# Patient Record
Sex: Female | Born: 1995 | Race: White | Hispanic: No | Marital: Married | Smoking: Never smoker
Health system: Southern US, Community
[De-identification: ages and names within clinical notes are randomized; demographics above are authoritative.]

## PROBLEM LIST (undated history)

## (undated) DIAGNOSIS — D619 Aplastic anemia, unspecified: Secondary | ICD-10-CM

## (undated) DIAGNOSIS — J45909 Unspecified asthma, uncomplicated: Secondary | ICD-10-CM

## (undated) DIAGNOSIS — F909 Attention-deficit hyperactivity disorder, unspecified type: Secondary | ICD-10-CM

## (undated) HISTORY — PX: PORTA CATH INSERTION: CATH118285

---
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
## 2017-12-31 IMAGING — CT CT ANGIO CHEST
2 of 6 series · 18 of 36 positions shown · IV contrast (Omni 300)
Comparison: Chest radiograph dated 11/29/2016

CLINICAL DATA: 20-year-old female with chest pain and shortness of
breath. Tachycardia.

EXAM:
CT ANGIOGRAPHY CHEST WITH CONTRAST
TECHNIQUE: Multidetector CT imaging of the chest was performed using the
standard protocol during bolus administration of intravenous
contrast. Multiplanar CT image reconstructions and MIPs were
obtained to evaluate the vascular anatomy.
CONTRAST:  100 cc Isovue 370

[Series 6: pe thins · axial · 0.80mm/px · z∈[+972,+1210]mm · 17 of 268 slices shown]
[im 15/268  lung]
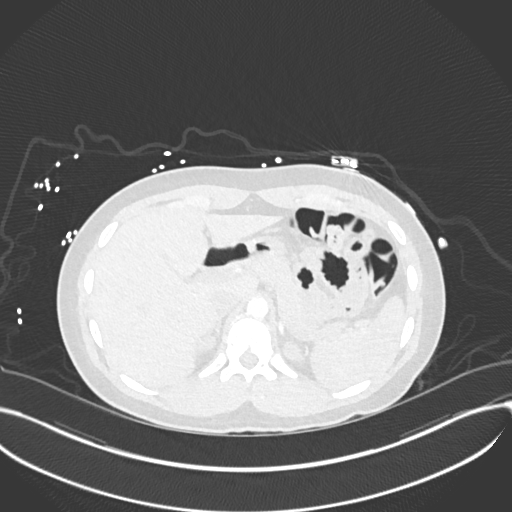
[im 30/268  mediastinal]
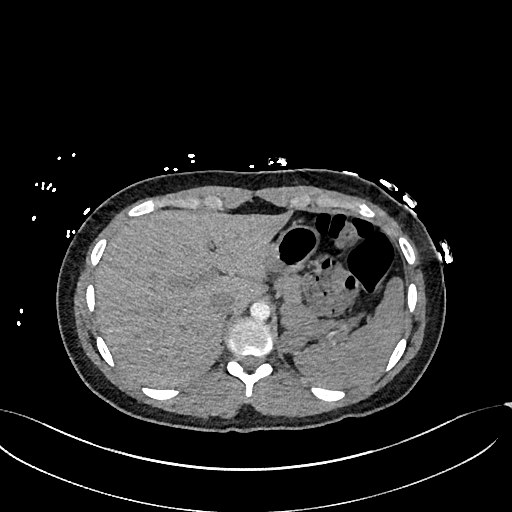
[im 45/268  lung]
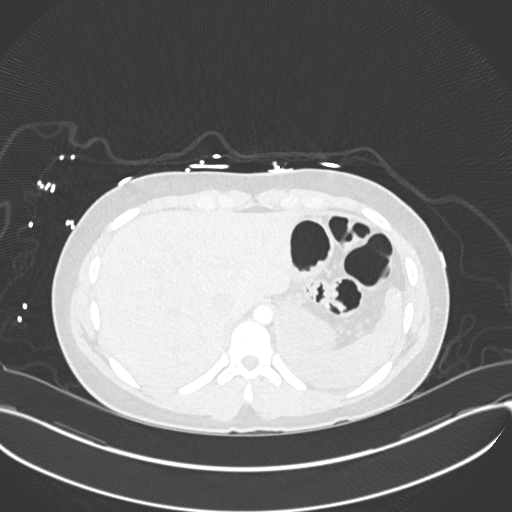
[im 60/268  mediastinal]
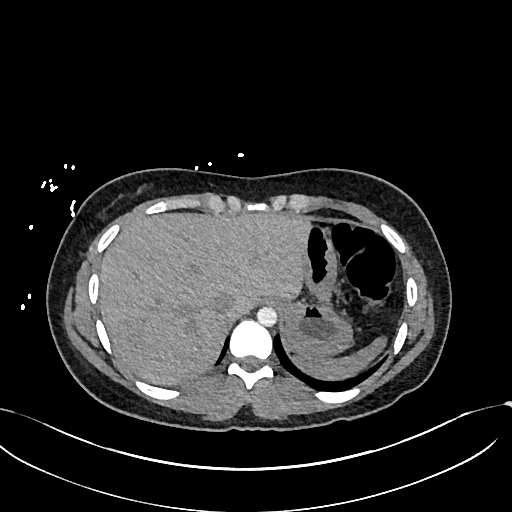
[im 75/268  lung]
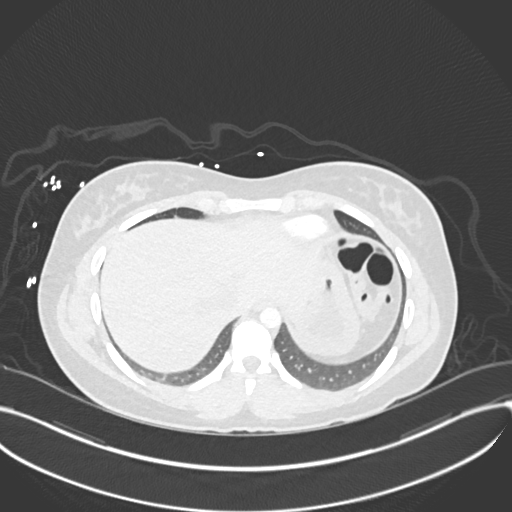
[im 90/268  mediastinal]
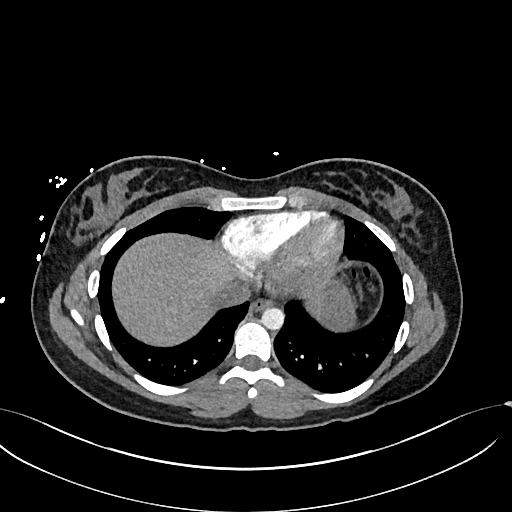
[im 104/268  lung]
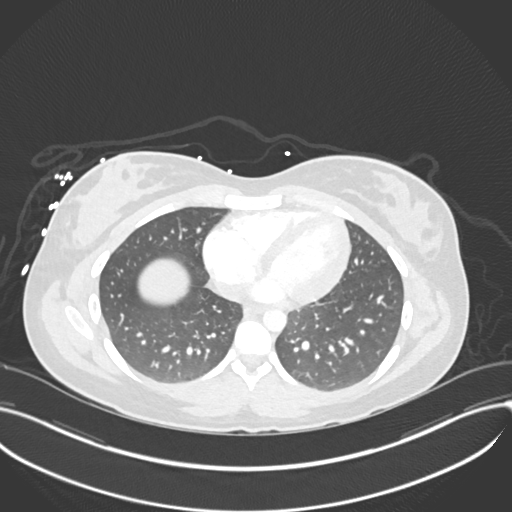
[im 119/268  mediastinal]
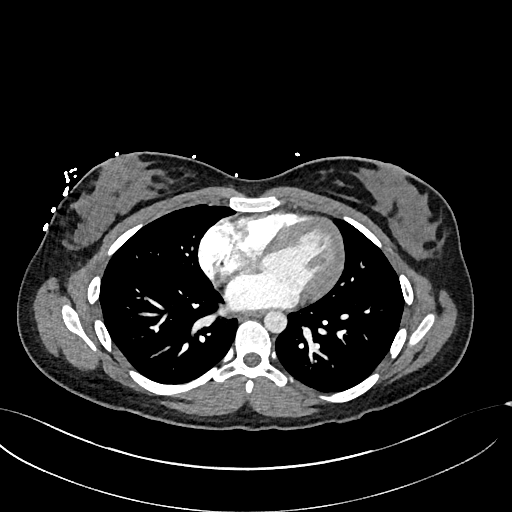
[im 134/268  lung]
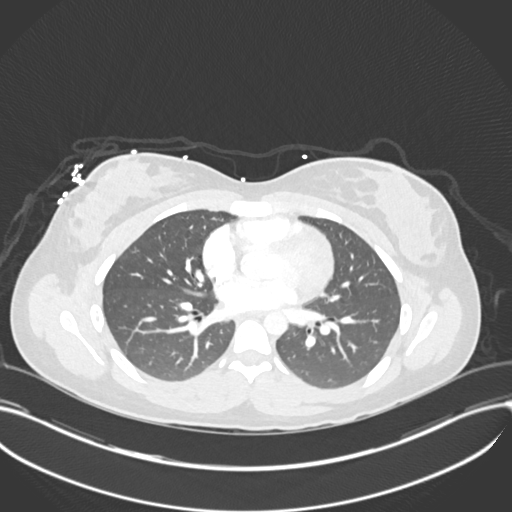
[im 149/268  mediastinal]
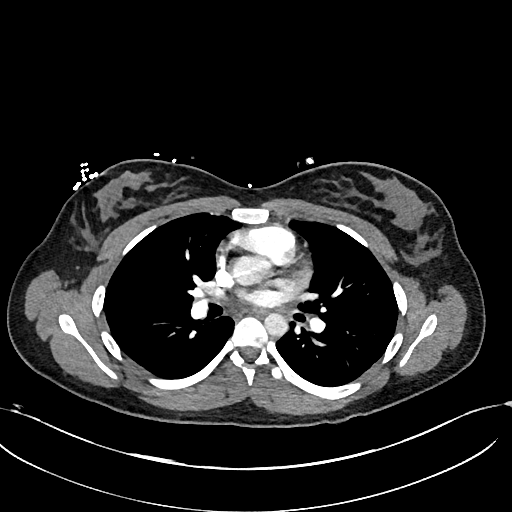
[im 164/268  lung]
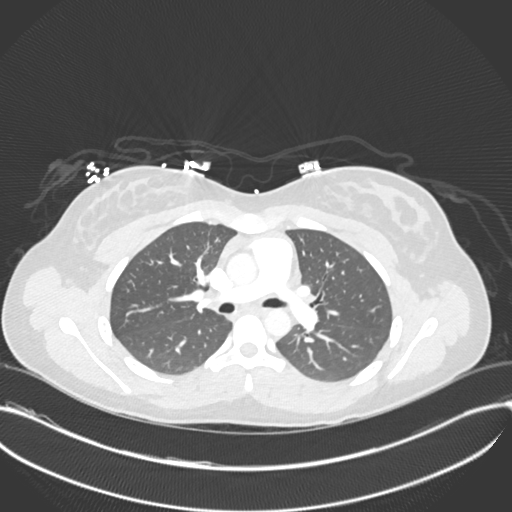
[im 179/268  mediastinal]
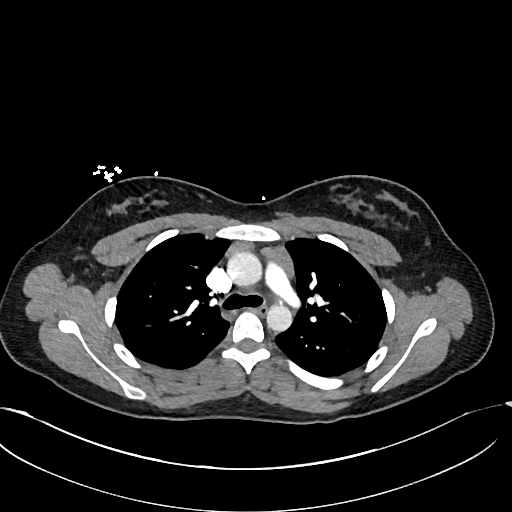
[im 193/268  lung]
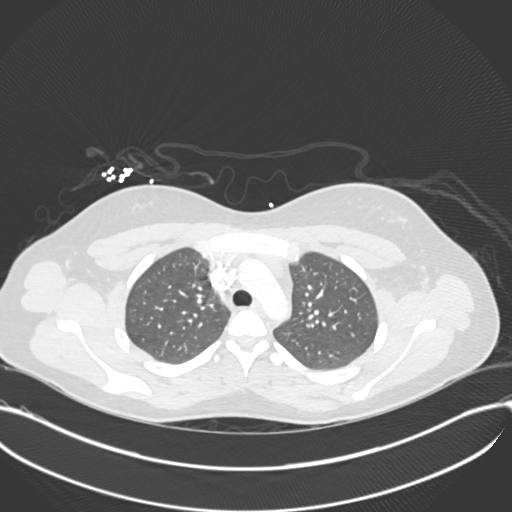
[im 208/268  mediastinal]
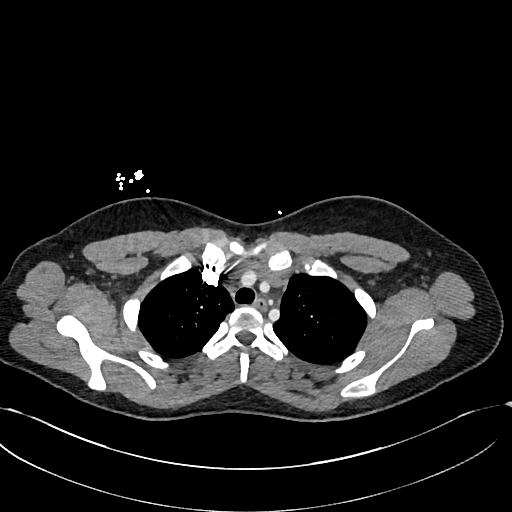
[im 223/268  lung]
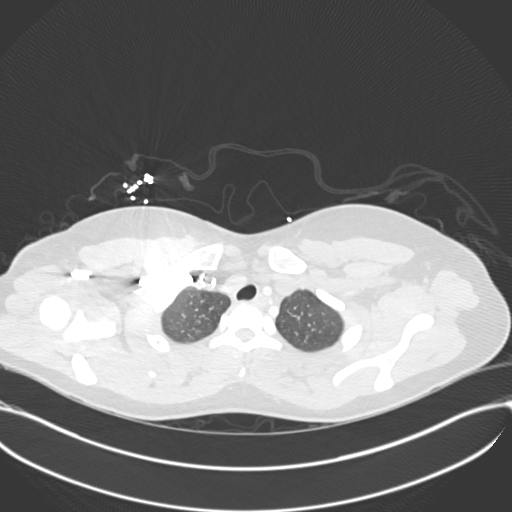
[im 238/268  mediastinal]
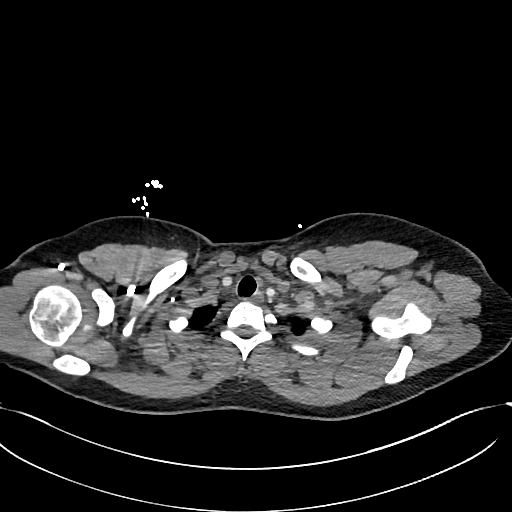
[im 253/268  lung]
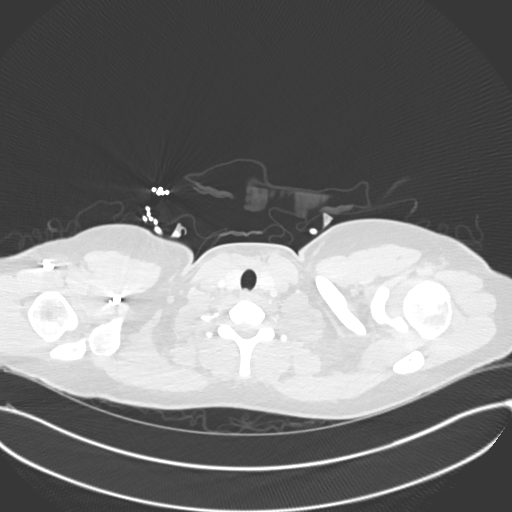

[Series 7: pe 2mm cor · coronal · 0.59mm/px · 1 of 114 slices shown]
[im 57/114  mediastinal]
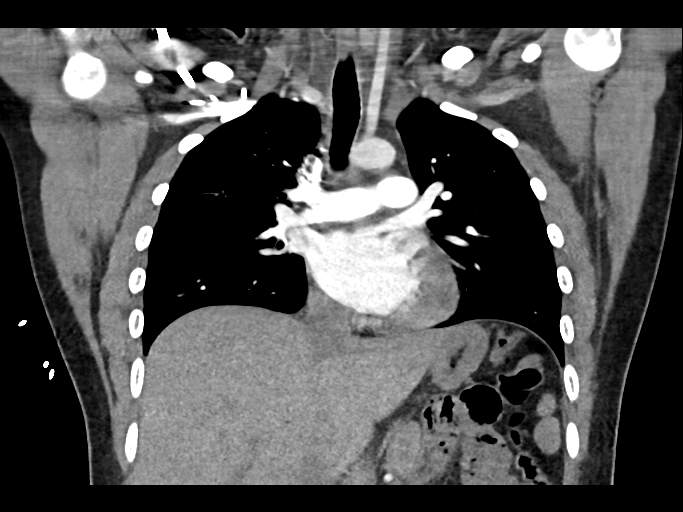

[18 of 36 positions shown; findings below may reference images not displayed]

FINDINGS: Cardiovascular: There is no cardiomegaly or pericardial effusion.
The thoracic aorta appears unremarkable. The origins of the great
vessels of the aortic arch appear patent. There is no CT evidence of
pulmonary embolism.

Mediastinum/Nodes: No hilar or mediastinal adenopathy. The esophagus
and the thyroid gland are grossly unremarkable. No mediastinal fluid
collection or hematoma.

Lungs/Pleura: Lungs are clear. No pleural effusion or pneumothorax.

Upper Abdomen: No acute abnormality.

Musculoskeletal: No chest wall abnormality. No acute or significant
osseous findings.

Review of the MIP images confirms the above findings.
IMPRESSION: No acute intrathoracic pathology. No CT evidence of pulmonary
embolism.

## 2020-10-04 ENCOUNTER — Telehealth (HOSPITAL_COMMUNITY): Payer: Self-pay

## 2020-10-04 NOTE — Telephone Encounter (Signed)
Called and spoke with pt mother Tobi Bastos who stated pt is interested in our PR program. Will fax PR MD order and once recv'ed back, will enter PR referral.  Faxed ppw to Dr. Leonides Grills at [facility].  Ph. [phone] Fax [phone]

## 2020-11-06 DEATH — deceased
